# Patient Record
Sex: Female | Born: 1960 | Race: White | Hispanic: No | Marital: Married
Health system: Southern US, Community
[De-identification: ages and names within clinical notes are randomized; demographics above are authoritative.]

## PROBLEM LIST (undated history)

## (undated) DIAGNOSIS — C801 Malignant (primary) neoplasm, unspecified: Secondary | ICD-10-CM

## (undated) DIAGNOSIS — Z923 Personal history of irradiation: Secondary | ICD-10-CM

## (undated) HISTORY — PX: AUGMENTATION MAMMAPLASTY: SUR837

---

## 1998-09-02 ENCOUNTER — Encounter: Admission: RE | Admit: 1998-09-02 | Discharge: 1998-09-02 | Payer: Self-pay | Admitting: *Deleted

## 2008-05-30 ENCOUNTER — Encounter: Admission: RE | Admit: 2008-05-30 | Discharge: 2008-05-30 | Payer: Self-pay | Admitting: Obstetrics and Gynecology

## 2009-01-20 ENCOUNTER — Encounter: Admission: RE | Admit: 2009-01-20 | Discharge: 2009-01-20 | Payer: Self-pay | Admitting: Obstetrics and Gynecology

## 2009-06-30 ENCOUNTER — Encounter: Admission: RE | Admit: 2009-06-30 | Discharge: 2009-06-30 | Payer: Self-pay | Admitting: Obstetrics and Gynecology

## 2009-06-30 ENCOUNTER — Encounter (INDEPENDENT_AMBULATORY_CARE_PROVIDER_SITE_OTHER): Payer: Self-pay | Admitting: Diagnostic Radiology

## 2009-07-06 ENCOUNTER — Encounter: Admission: RE | Admit: 2009-07-06 | Discharge: 2009-07-06 | Payer: Self-pay | Admitting: Obstetrics and Gynecology

## 2009-08-25 ENCOUNTER — Encounter (INDEPENDENT_AMBULATORY_CARE_PROVIDER_SITE_OTHER): Payer: Self-pay | Admitting: Surgery

## 2009-08-25 ENCOUNTER — Inpatient Hospital Stay (HOSPITAL_COMMUNITY): Admission: RE | Admit: 2009-08-25 | Discharge: 2009-08-28 | Payer: Self-pay | Admitting: Surgery

## 2009-09-10 ENCOUNTER — Ambulatory Visit: Payer: Self-pay | Admitting: Oncology

## 2009-09-29 LAB — COMPREHENSIVE METABOLIC PANEL
ALT: 15 U/L (ref 0–35)
AST: 16 U/L (ref 0–37)
Calcium: 8.9 mg/dL (ref 8.4–10.5)
Chloride: 108 mEq/L (ref 96–112)
Creatinine, Ser: 0.8 mg/dL (ref 0.40–1.20)
Potassium: 3.6 mEq/L (ref 3.5–5.3)
Sodium: 142 mEq/L (ref 135–145)
Total Protein: 6.5 g/dL (ref 6.0–8.3)

## 2009-09-29 LAB — CBC WITH DIFFERENTIAL/PLATELET
BASO%: 0.4 % (ref 0.0–2.0)
Basophils Absolute: 0 10*3/uL (ref 0.0–0.1)
EOS%: 2.2 % (ref 0.0–7.0)
HCT: 34.7 % — ABNORMAL LOW (ref 34.8–46.6)
HGB: 12.2 g/dL (ref 11.6–15.9)
MCV: 87.4 fL (ref 79.5–101.0)
MONO#: 0.4 10*3/uL (ref 0.1–0.9)
MONO%: 8.3 % (ref 0.0–14.0)
NEUT%: 52.8 % (ref 38.4–76.8)
Platelets: 161 10*3/uL (ref 145–400)
WBC: 4.4 10*3/uL (ref 3.9–10.3)
lymph#: 1.6 10*3/uL (ref 0.9–3.3)

## 2009-10-06 ENCOUNTER — Encounter: Admission: RE | Admit: 2009-10-06 | Discharge: 2009-10-06 | Payer: Self-pay | Admitting: Oncology

## 2009-10-13 ENCOUNTER — Ambulatory Visit: Payer: Self-pay | Admitting: Oncology

## 2009-10-15 LAB — CBC WITH DIFFERENTIAL/PLATELET
BASO%: 0.5 % (ref 0.0–2.0)
Eosinophils Absolute: 0.1 10*3/uL (ref 0.0–0.5)
HCT: 36 % (ref 34.8–46.6)
LYMPH%: 36.2 % (ref 14.0–49.7)
MCHC: 34.2 g/dL (ref 31.5–36.0)
MCV: 88.3 fL (ref 79.5–101.0)
MONO%: 7.2 % (ref 0.0–14.0)

## 2009-10-15 LAB — COMPREHENSIVE METABOLIC PANEL
AST: 19 U/L (ref 0–37)
Albumin: 3.8 g/dL (ref 3.5–5.2)
BUN: 12 mg/dL (ref 6–23)
Calcium: 8.9 mg/dL (ref 8.4–10.5)
Total Bilirubin: 0.5 mg/dL (ref 0.3–1.2)

## 2010-02-23 ENCOUNTER — Ambulatory Visit: Payer: Self-pay | Admitting: Oncology

## 2010-02-25 LAB — CBC WITH DIFFERENTIAL/PLATELET
BASO%: 0.6 % (ref 0.0–2.0)
HGB: 13.6 g/dL (ref 11.6–15.9)
MCV: 87.9 fL (ref 79.5–101.0)
MONO%: 6.6 % (ref 0.0–14.0)
NEUT#: 2.6 10*3/uL (ref 1.5–6.5)
Platelets: 153 10*3/uL (ref 145–400)
WBC: 4.6 10*3/uL (ref 3.9–10.3)

## 2010-02-25 LAB — COMPREHENSIVE METABOLIC PANEL
ALT: 12 U/L (ref 0–35)
Albumin: 4.4 g/dL (ref 3.5–5.2)
BUN: 12 mg/dL (ref 6–23)
Calcium: 9.2 mg/dL (ref 8.4–10.5)
Creatinine, Ser: 0.81 mg/dL (ref 0.40–1.20)

## 2010-07-01 ENCOUNTER — Encounter: Admission: RE | Admit: 2010-07-01 | Discharge: 2010-07-01 | Payer: Self-pay | Admitting: Oncology

## 2010-08-27 ENCOUNTER — Ambulatory Visit: Payer: Self-pay | Admitting: Oncology

## 2010-08-31 LAB — COMPREHENSIVE METABOLIC PANEL
ALT: 19 U/L (ref 0–35)
AST: 20 U/L (ref 0–37)
Alkaline Phosphatase: 91 U/L (ref 39–117)
BUN: 12 mg/dL (ref 6–23)
CO2: 31 mEq/L (ref 19–32)
Chloride: 106 mEq/L (ref 96–112)
Potassium: 3.6 mEq/L (ref 3.5–5.3)
Sodium: 142 mEq/L (ref 135–145)
Total Bilirubin: 0.8 mg/dL (ref 0.3–1.2)
Total Protein: 7.6 g/dL (ref 6.0–8.3)

## 2010-08-31 LAB — VITAMIN D 25 HYDROXY (VIT D DEFICIENCY, FRACTURES): Vit D, 25-Hydroxy: 53 ng/mL (ref 30–89)

## 2010-08-31 LAB — CBC WITH DIFFERENTIAL/PLATELET
Basophils Absolute: 0 10*3/uL (ref 0.0–0.1)
EOS%: 1.8 % (ref 0.0–7.0)
LYMPH%: 28.4 % (ref 14.0–49.7)
MCH: 30.4 pg (ref 25.1–34.0)
MCV: 89.5 fL (ref 79.5–101.0)
MONO#: 0.5 10*3/uL (ref 0.1–0.9)
MONO%: 7.9 % (ref 0.0–14.0)
RDW: 13.3 % (ref 11.2–14.5)
WBC: 5.8 10*3/uL (ref 3.9–10.3)

## 2011-03-18 LAB — DIFFERENTIAL
Basophils Absolute: 0 10*3/uL (ref 0.0–0.1)
Eosinophils Absolute: 0.1 10*3/uL (ref 0.0–0.7)

## 2011-03-18 LAB — COMPREHENSIVE METABOLIC PANEL
ALT: 16 U/L (ref 0–35)
Albumin: 3.9 g/dL (ref 3.5–5.2)
Alkaline Phosphatase: 73 U/L (ref 39–117)
BUN: 13 mg/dL (ref 6–23)
CO2: 28 mEq/L (ref 19–32)
Calcium: 8.9 mg/dL (ref 8.4–10.5)
Chloride: 107 mEq/L (ref 96–112)
Creatinine, Ser: 0.76 mg/dL (ref 0.4–1.2)
GFR calc Af Amer: >60 mL/min (ref >60)
Glucose, Bld: 92 mg/dL (ref 70–99)
Potassium: 4 mEq/L (ref 3.5–5.1)
Sodium: 142 mEq/L (ref 135–145)
Total Protein: 6.3 g/dL (ref 6.0–8.3)

## 2011-03-18 LAB — CBC
Hemoglobin: 13.1 g/dL (ref 12.0–15.0)
MCHC: 34.8 g/dL (ref 30.0–36.0)
MCV: 90.2 fL (ref 78.0–100.0)
Platelets: 129 10*3/uL — ABNORMAL LOW (ref 150–400)
RBC: 4.16 MIL/uL (ref 3.87–5.11)
WBC: 4.6 10*3/uL (ref 4.0–10.5)

## 2011-06-09 ENCOUNTER — Other Ambulatory Visit: Payer: Self-pay | Admitting: Oncology

## 2011-06-09 ENCOUNTER — Other Ambulatory Visit: Payer: Self-pay | Admitting: Obstetrics and Gynecology

## 2011-06-09 DIAGNOSIS — Z9882 Breast implant status: Secondary | ICD-10-CM

## 2011-06-09 DIAGNOSIS — Z9012 Acquired absence of left breast and nipple: Secondary | ICD-10-CM

## 2011-07-04 ENCOUNTER — Ambulatory Visit
Admission: RE | Admit: 2011-07-04 | Discharge: 2011-07-04 | Disposition: A | Discharge status: Home / Self Care | Payer: BC Managed Care – PPO | Source: Ambulatory Visit | Attending: Obstetrics and Gynecology | Admitting: Obstetrics and Gynecology

## 2011-07-04 DIAGNOSIS — Z9012 Acquired absence of left breast and nipple: Secondary | ICD-10-CM

## 2011-07-04 DIAGNOSIS — Z9882 Breast implant status: Secondary | ICD-10-CM

## 2011-09-01 ENCOUNTER — Other Ambulatory Visit: Payer: Self-pay | Admitting: Oncology

## 2011-09-01 ENCOUNTER — Encounter (HOSPITAL_BASED_OUTPATIENT_CLINIC_OR_DEPARTMENT_OTHER): Payer: BC Managed Care – PPO | Admitting: Oncology

## 2011-09-01 DIAGNOSIS — C50919 Malignant neoplasm of unspecified site of unspecified female breast: Secondary | ICD-10-CM

## 2011-09-01 DIAGNOSIS — D0590 Unspecified type of carcinoma in situ of unspecified breast: Secondary | ICD-10-CM

## 2011-09-01 LAB — COMPREHENSIVE METABOLIC PANEL
ALT: 12 U/L (ref 0–35)
AST: 15 U/L (ref 0–37)
Chloride: 104 mEq/L (ref 96–112)
Creatinine, Ser: 0.64 mg/dL (ref 0.50–1.10)
Total Bilirubin: 0.2 mg/dL — ABNORMAL LOW (ref 0.3–1.2)

## 2011-09-01 LAB — CBC WITH DIFFERENTIAL/PLATELET
BASO%: 0.6 % (ref 0.0–2.0)
EOS%: 2.6 % (ref 0.0–7.0)
HCT: 38.9 % (ref 34.8–46.6)
LYMPH%: 34.5 % (ref 14.0–49.7)
MCH: 30.8 pg (ref 25.1–34.0)
MCHC: 34.6 g/dL (ref 31.5–36.0)
NEUT%: 56.6 % (ref 38.4–76.8)
lymph#: 1.8 10*3/uL (ref 0.9–3.3)

## 2011-09-08 ENCOUNTER — Encounter (HOSPITAL_BASED_OUTPATIENT_CLINIC_OR_DEPARTMENT_OTHER): Payer: BC Managed Care – PPO | Admitting: Oncology

## 2011-09-08 DIAGNOSIS — C50919 Malignant neoplasm of unspecified site of unspecified female breast: Secondary | ICD-10-CM

## 2011-09-08 DIAGNOSIS — Z170 Estrogen receptor positive status [ER+]: Secondary | ICD-10-CM

## 2011-09-08 DIAGNOSIS — D0590 Unspecified type of carcinoma in situ of unspecified breast: Secondary | ICD-10-CM

## 2011-12-13 HISTORY — PX: MASTECTOMY: SHX3

## 2012-05-29 ENCOUNTER — Other Ambulatory Visit: Payer: Self-pay | Admitting: Obstetrics and Gynecology

## 2012-05-29 DIAGNOSIS — Z1231 Encounter for screening mammogram for malignant neoplasm of breast: Secondary | ICD-10-CM

## 2012-07-04 ENCOUNTER — Ambulatory Visit
Admission: RE | Admit: 2012-07-04 | Discharge: 2012-07-04 | Disposition: A | Discharge status: Home / Self Care | Payer: BC Managed Care – PPO | Source: Ambulatory Visit | Attending: Obstetrics and Gynecology | Admitting: Obstetrics and Gynecology

## 2012-07-04 DIAGNOSIS — Z1231 Encounter for screening mammogram for malignant neoplasm of breast: Secondary | ICD-10-CM

## 2012-08-08 ENCOUNTER — Other Ambulatory Visit: Payer: Self-pay | Admitting: Obstetrics and Gynecology

## 2012-08-11 ENCOUNTER — Telehealth: Payer: Self-pay | Admitting: Oncology

## 2012-08-11 NOTE — Telephone Encounter (Signed)
S/w pt re appts for 10/3 and 10/10. Per pt she did not have her calendar w/her and would call back Tuesday re appts.

## 2012-08-14 ENCOUNTER — Telehealth: Payer: Self-pay | Admitting: *Deleted

## 2012-09-13 ENCOUNTER — Other Ambulatory Visit (HOSPITAL_BASED_OUTPATIENT_CLINIC_OR_DEPARTMENT_OTHER): Payer: BC Managed Care – PPO | Admitting: Lab

## 2012-09-13 DIAGNOSIS — C50919 Malignant neoplasm of unspecified site of unspecified female breast: Secondary | ICD-10-CM

## 2012-09-13 LAB — CBC WITH DIFFERENTIAL/PLATELET
BASO%: 0.8 % (ref 0.0–2.0)
Eosinophils Absolute: 0.1 10*3/uL (ref 0.0–0.5)
HCT: 39 % (ref 34.8–46.6)
HGB: 13.6 g/dL (ref 11.6–15.9)
LYMPH%: 41 % (ref 14.0–49.7)
MCHC: 34.9 g/dL (ref 31.5–36.0)
MONO#: 0.3 10*3/uL (ref 0.1–0.9)
NEUT#: 2.7 10*3/uL (ref 1.5–6.5)
NEUT%: 50.1 % (ref 38.4–76.8)
Platelets: 155 10*3/uL (ref 145–400)
WBC: 5.3 10*3/uL (ref 3.9–10.3)
lymph#: 2.2 10*3/uL (ref 0.9–3.3)

## 2012-09-13 LAB — COMPREHENSIVE METABOLIC PANEL (CC13)
ALT: 13 U/L (ref 0–55)
CO2: 24 mEq/L (ref 22–29)
Calcium: 9.1 mg/dL (ref 8.4–10.4)
Chloride: 107 mEq/L (ref 98–107)
Creatinine: 0.8 mg/dL (ref 0.6–1.1)
Glucose: 88 mg/dl (ref 70–99)
Sodium: 142 mEq/L (ref 136–145)
Total Bilirubin: 0.5 mg/dL (ref 0.20–1.20)
Total Protein: 6.8 g/dL (ref 6.4–8.3)

## 2012-09-14 LAB — VITAMIN D 25 HYDROXY (VIT D DEFICIENCY, FRACTURES): Vit D, 25-Hydroxy: 37 ng/mL (ref 30–89)

## 2012-09-20 ENCOUNTER — Ambulatory Visit (HOSPITAL_BASED_OUTPATIENT_CLINIC_OR_DEPARTMENT_OTHER): Payer: BC Managed Care – PPO | Admitting: Oncology

## 2012-09-20 VITALS — BP 132/74 | HR 72 | Temp 98.8°F | Resp 20 | Ht 67.0 in | Wt 137.8 lb

## 2012-09-20 DIAGNOSIS — Z853 Personal history of malignant neoplasm of breast: Secondary | ICD-10-CM

## 2012-09-20 DIAGNOSIS — C50919 Malignant neoplasm of unspecified site of unspecified female breast: Secondary | ICD-10-CM

## 2012-09-20 NOTE — Progress Notes (Signed)
Hematology and Oncology Follow Up Visit  Stefanie Miller [id number] Aug 28, 1961 51 y.o. 09/20/2012 6:03 PM   DIAGNOSIS:   :  History of recurrent DCIS with ER/PR positive, previously on hormonal therapy, status post TRAM flap on left side.  Date of surgery is September 2008.  Now on no medication.  PAST THERAPY:    Interim History:  Patient returns for followup. 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 She is doing well. She has up-to-date on imaging studies. She has no complaints. Most recent DEXA scan was in October 2010.  Medications: I have reviewed the patient's current medications.  Allergies:  Allergies  Allergen Reactions  . Sulfa Antibiotics     Past Medical History, Surgical history, Social history, and Family History were reviewed and updated.  Review of Systems: Constitutional:  Negative for fever, chills, night sweats, anorexia, weight loss, pain. Cardiovascular: no chest pain or dyspnea on exertion Respiratory: negative Neurological: negative Dermatological: negative ENT: negative Skin Gastrointestinal: negative Genito-Urinary: negative Hematological and Lymphatic: negative Breast: negative Musculoskeletal: negative Remaining ROS negative.  Physical Exam:  Blood pressure 132/74, pulse 72, temperature 98.8 F (37.1 C), temperature source Oral, resp. rate 20, height 5\' 7"  (1.702 m), weight 137 lb 12.8 oz (62.506 kg).  ECOG: 0  HEENT:  Sclerae anicteric, conjunctivae pink.  Oropharynx clear.  No mucositis or candidiasis.  Nodes:  No cervical, supraclavicular, or axillary lymphadenopathy palpated.  Breast Exam:  Right breast is benign.  No masses, discharge, skin change, or nipple inversion.  Left breast is status post TRAM flap No masses, discharge, skin change, or nipple inversion..  Lungs:  Clear to auscultation bilaterally.  No crackles, rhonchi, or wheezes.  Heart:  Regular rate and rhythm.  Abdomen:  Soft, nontender.  Positive bowel  sounds.  No organomegaly or masses palpated.  Musculoskeletal:  No focal spinal tenderness to palpation.  Extremities:  Benign.  No peripheral edema or cyanosis.  Skin:  Benign.  Neuro:  Nonfocal.   Lab Results: Lab Results  Component Value Date   WBC 5.3 09/13/2012   HGB 13.6 09/13/2012   HCT 39.0 09/13/2012   MCV 89.8 09/13/2012   PLT 155 09/13/2012     Chemistry      Component Value Date/Time   NA 142 09/13/2012 1416   NA 141 09/01/2011 1606   K 3.9 09/13/2012 1416   K 3.6 09/01/2011 1606   CL 107 09/13/2012 1416   CL 104 09/01/2011 1606   CO2 24 09/13/2012 1416   CO2 29 09/01/2011 1606   BUN 15.0 09/13/2012 1416   BUN 20 09/01/2011 1606   CREATININE 0.8 09/13/2012 1416   CREATININE 0.64 09/01/2011 1606      Component Value Date/Time   CALCIUM 9.1 09/13/2012 1416   CALCIUM 9.3 09/01/2011 1606   ALKPHOS 103 09/13/2012 1416   ALKPHOS 114 09/01/2011 1606   AST 16 09/13/2012 1416   AST 15 09/01/2011 1606   ALT 13 09/13/2012 1416   ALT 12 09/01/2011 1606   BILITOT 0.50 09/13/2012 1416   BILITOT 0.2* 09/01/2011 1606       Radiological Studies:  No results found.   IMPRESSIONS AND PLAN: A 51 y.o. female with   History of DCIS status post mastectomy and TRAM flap 5 years of hormonal therapy. She is doing well. She has a lot of good followup with her primary care Dr. and OB/GYN. She is interested in having called with those physicians. I have therefore not scheduled her  for regular followup visit. I have the left the situation open so that she can come back and see [place] at anytime in the future, should the need arise.  Spent more than half the time coordinating care, as well as discussion of BMI and its implications.      Stefanie Miller 10/10/20136:03 PM Cell [phone]

## 2013-05-27 ENCOUNTER — Other Ambulatory Visit: Payer: Self-pay

## 2013-05-27 DIAGNOSIS — Z1231 Encounter for screening mammogram for malignant neoplasm of breast: Secondary | ICD-10-CM

## 2013-07-05 ENCOUNTER — Ambulatory Visit
Admission: RE | Admit: 2013-07-05 | Discharge: 2013-07-05 | Disposition: A | Discharge status: Home / Self Care | Payer: No Typology Code available for payment source | Source: Ambulatory Visit

## 2013-07-05 ENCOUNTER — Other Ambulatory Visit: Payer: Self-pay

## 2013-07-05 DIAGNOSIS — Z1231 Encounter for screening mammogram for malignant neoplasm of breast: Secondary | ICD-10-CM

## 2014-02-24 ENCOUNTER — Ambulatory Visit
Admission: RE | Admit: 2014-02-24 | Discharge: 2014-02-24 | Disposition: A | Discharge status: Home / Self Care | Payer: PRIVATE HEALTH INSURANCE | Source: Ambulatory Visit | Attending: Family Medicine | Admitting: Family Medicine

## 2014-02-24 ENCOUNTER — Other Ambulatory Visit: Payer: Self-pay | Admitting: Family Medicine

## 2014-02-24 DIAGNOSIS — R5383 Other fatigue: Secondary | ICD-10-CM

## 2014-02-24 HISTORY — DX: Malignant (primary) neoplasm, unspecified: C80.1

## 2014-06-02 ENCOUNTER — Other Ambulatory Visit: Payer: Self-pay

## 2014-06-02 DIAGNOSIS — Z1231 Encounter for screening mammogram for malignant neoplasm of breast: Secondary | ICD-10-CM

## 2014-07-09 ENCOUNTER — Encounter (INDEPENDENT_AMBULATORY_CARE_PROVIDER_SITE_OTHER): Payer: Self-pay

## 2014-07-09 ENCOUNTER — Ambulatory Visit
Admission: RE | Admit: 2014-07-09 | Discharge: 2014-07-09 | Disposition: A | Discharge status: Home / Self Care | Payer: No Typology Code available for payment source | Source: Ambulatory Visit

## 2014-07-09 ENCOUNTER — Other Ambulatory Visit: Payer: Self-pay

## 2014-07-09 DIAGNOSIS — Z1231 Encounter for screening mammogram for malignant neoplasm of breast: Secondary | ICD-10-CM

## 2014-08-26 ENCOUNTER — Other Ambulatory Visit: Payer: Self-pay | Admitting: Obstetrics and Gynecology

## 2014-08-27 LAB — CYTOLOGY - PAP

## 2015-06-08 ENCOUNTER — Other Ambulatory Visit: Payer: Self-pay

## 2015-06-08 DIAGNOSIS — Z1231 Encounter for screening mammogram for malignant neoplasm of breast: Secondary | ICD-10-CM

## 2015-07-10 ENCOUNTER — Other Ambulatory Visit: Payer: Self-pay | Admitting: Obstetrics and Gynecology

## 2015-07-14 ENCOUNTER — Ambulatory Visit
Admission: RE | Admit: 2015-07-14 | Discharge: 2015-07-14 | Disposition: A | Discharge status: Home / Self Care | Payer: No Typology Code available for payment source | Source: Ambulatory Visit

## 2015-07-14 DIAGNOSIS — Z1231 Encounter for screening mammogram for malignant neoplasm of breast: Secondary | ICD-10-CM

## 2015-10-20 ENCOUNTER — Telehealth: Payer: Self-pay | Admitting: Oncology

## 2015-10-20 NOTE — Telephone Encounter (Signed)
Request records from 2010-2013

## 2016-01-19 ENCOUNTER — Other Ambulatory Visit: Payer: Self-pay | Admitting: Family Medicine

## 2016-01-19 DIAGNOSIS — R591 Generalized enlarged lymph nodes: Secondary | ICD-10-CM

## 2016-01-25 ENCOUNTER — Ambulatory Visit
Admission: RE | Admit: 2016-01-25 | Discharge: 2016-01-25 | Disposition: A | Discharge status: Home / Self Care | Payer: 59 | Source: Ambulatory Visit | Attending: Family Medicine | Admitting: Family Medicine

## 2016-01-25 DIAGNOSIS — R591 Generalized enlarged lymph nodes: Secondary | ICD-10-CM

## 2016-01-25 MED ORDER — IOPAMIDOL (ISOVUE-300) INJECTION 61%
75.0000 mL | Freq: Once | INTRAVENOUS | Status: AC | PRN
  Administered 2016-01-25: 75 mL via INTRAVENOUS

## 2016-02-23 ENCOUNTER — Other Ambulatory Visit: Payer: Self-pay | Admitting: Cardiology

## 2016-02-23 ENCOUNTER — Ambulatory Visit
Admission: RE | Admit: 2016-02-23 | Discharge: 2016-02-23 | Disposition: A | Discharge status: Home / Self Care | Payer: 59 | Source: Ambulatory Visit | Attending: Cardiology | Admitting: Cardiology

## 2016-02-23 DIAGNOSIS — R0789 Other chest pain: Secondary | ICD-10-CM

## 2016-07-19 ENCOUNTER — Other Ambulatory Visit: Payer: Self-pay | Admitting: Obstetrics and Gynecology

## 2016-07-19 DIAGNOSIS — Z1231 Encounter for screening mammogram for malignant neoplasm of breast: Secondary | ICD-10-CM

## 2016-07-22 ENCOUNTER — Ambulatory Visit: Payer: 59

## 2016-07-26 ENCOUNTER — Other Ambulatory Visit: Payer: Self-pay | Admitting: Obstetrics and Gynecology

## 2016-07-26 ENCOUNTER — Ambulatory Visit
Admission: RE | Admit: 2016-07-26 | Discharge: 2016-07-26 | Disposition: A | Discharge status: Home / Self Care | Payer: 59 | Source: Ambulatory Visit | Attending: Obstetrics and Gynecology | Admitting: Obstetrics and Gynecology

## 2016-07-26 DIAGNOSIS — Z1231 Encounter for screening mammogram for malignant neoplasm of breast: Secondary | ICD-10-CM

## 2017-07-07 ENCOUNTER — Other Ambulatory Visit: Payer: Self-pay | Admitting: Obstetrics and Gynecology

## 2017-07-07 DIAGNOSIS — Z1231 Encounter for screening mammogram for malignant neoplasm of breast: Secondary | ICD-10-CM

## 2017-07-28 ENCOUNTER — Ambulatory Visit
Admission: RE | Admit: 2017-07-28 | Discharge: 2017-07-28 | Disposition: A | Discharge status: Home / Self Care | Payer: BLUE CROSS/BLUE SHIELD | Source: Ambulatory Visit | Attending: Obstetrics and Gynecology | Admitting: Obstetrics and Gynecology

## 2017-07-28 DIAGNOSIS — Z1231 Encounter for screening mammogram for malignant neoplasm of breast: Secondary | ICD-10-CM

## 2017-10-10 DIAGNOSIS — R300 Dysuria: Secondary | ICD-10-CM | POA: Diagnosis not present

## 2017-10-10 DIAGNOSIS — N3090 Cystitis, unspecified without hematuria: Secondary | ICD-10-CM | POA: Diagnosis not present

## 2017-10-27 DIAGNOSIS — Z124 Encounter for screening for malignant neoplasm of cervix: Secondary | ICD-10-CM | POA: Diagnosis not present

## 2017-10-27 DIAGNOSIS — Z01419 Encounter for gynecological examination (general) (routine) without abnormal findings: Secondary | ICD-10-CM | POA: Diagnosis not present

## 2018-02-06 DIAGNOSIS — H6121 Impacted cerumen, right ear: Secondary | ICD-10-CM | POA: Diagnosis not present

## 2018-02-06 DIAGNOSIS — R454 Irritability and anger: Secondary | ICD-10-CM | POA: Diagnosis not present

## 2018-04-20 DIAGNOSIS — R197 Diarrhea, unspecified: Secondary | ICD-10-CM | POA: Diagnosis not present

## 2018-04-20 DIAGNOSIS — M792 Neuralgia and neuritis, unspecified: Secondary | ICD-10-CM | POA: Diagnosis not present

## 2018-04-20 DIAGNOSIS — M545 Low back pain: Secondary | ICD-10-CM | POA: Diagnosis not present

## 2018-05-02 DIAGNOSIS — L821 Other seborrheic keratosis: Secondary | ICD-10-CM | POA: Diagnosis not present

## 2018-05-02 DIAGNOSIS — Z1283 Encounter for screening for malignant neoplasm of skin: Secondary | ICD-10-CM | POA: Diagnosis not present

## 2018-05-02 DIAGNOSIS — X32XXXD Exposure to sunlight, subsequent encounter: Secondary | ICD-10-CM | POA: Diagnosis not present

## 2018-05-02 DIAGNOSIS — L570 Actinic keratosis: Secondary | ICD-10-CM | POA: Diagnosis not present

## 2018-05-02 DIAGNOSIS — L820 Inflamed seborrheic keratosis: Secondary | ICD-10-CM | POA: Diagnosis not present

## 2018-05-02 DIAGNOSIS — C4441 Basal cell carcinoma of skin of scalp and neck: Secondary | ICD-10-CM | POA: Diagnosis not present

## 2018-07-24 ENCOUNTER — Other Ambulatory Visit: Payer: Self-pay | Admitting: Family Medicine

## 2018-07-24 DIAGNOSIS — Z1231 Encounter for screening mammogram for malignant neoplasm of breast: Secondary | ICD-10-CM

## 2018-08-20 DIAGNOSIS — R197 Diarrhea, unspecified: Secondary | ICD-10-CM | POA: Diagnosis not present

## 2018-08-20 DIAGNOSIS — Z23 Encounter for immunization: Secondary | ICD-10-CM | POA: Diagnosis not present

## 2018-08-22 ENCOUNTER — Ambulatory Visit
Admission: RE | Admit: 2018-08-22 | Discharge: 2018-08-22 | Disposition: A | Discharge status: Home / Self Care | Payer: 59 | Source: Ambulatory Visit | Attending: Family Medicine | Admitting: Family Medicine

## 2018-08-22 DIAGNOSIS — R197 Diarrhea, unspecified: Secondary | ICD-10-CM | POA: Diagnosis not present

## 2018-08-22 DIAGNOSIS — Z1231 Encounter for screening mammogram for malignant neoplasm of breast: Secondary | ICD-10-CM

## 2018-08-24 DIAGNOSIS — R197 Diarrhea, unspecified: Secondary | ICD-10-CM | POA: Diagnosis not present

## 2018-11-06 DIAGNOSIS — Z13220 Encounter for screening for lipoid disorders: Secondary | ICD-10-CM | POA: Diagnosis not present

## 2018-11-06 DIAGNOSIS — Z0000 Encounter for general adult medical examination without abnormal findings: Secondary | ICD-10-CM | POA: Diagnosis not present

## 2018-11-14 DIAGNOSIS — L570 Actinic keratosis: Secondary | ICD-10-CM | POA: Diagnosis not present

## 2018-11-14 DIAGNOSIS — X32XXXD Exposure to sunlight, subsequent encounter: Secondary | ICD-10-CM | POA: Diagnosis not present

## 2018-11-14 DIAGNOSIS — L308 Other specified dermatitis: Secondary | ICD-10-CM | POA: Diagnosis not present

## 2018-11-14 DIAGNOSIS — L820 Inflamed seborrheic keratosis: Secondary | ICD-10-CM | POA: Diagnosis not present

## 2018-11-27 DIAGNOSIS — Z01419 Encounter for gynecological examination (general) (routine) without abnormal findings: Secondary | ICD-10-CM | POA: Diagnosis not present

## 2019-07-31 ENCOUNTER — Other Ambulatory Visit: Payer: Self-pay | Admitting: Family Medicine

## 2019-07-31 DIAGNOSIS — Z1231 Encounter for screening mammogram for malignant neoplasm of breast: Secondary | ICD-10-CM

## 2019-09-17 ENCOUNTER — Other Ambulatory Visit: Payer: Self-pay

## 2019-09-17 ENCOUNTER — Ambulatory Visit
Admission: RE | Admit: 2019-09-17 | Discharge: 2019-09-17 | Disposition: A | Discharge status: Home / Self Care | Payer: 59 | Source: Ambulatory Visit | Attending: Family Medicine | Admitting: Family Medicine

## 2019-09-17 DIAGNOSIS — Z1231 Encounter for screening mammogram for malignant neoplasm of breast: Secondary | ICD-10-CM

## 2020-02-03 ENCOUNTER — Other Ambulatory Visit: Payer: Self-pay | Admitting: Obstetrics and Gynecology

## 2020-02-03 DIAGNOSIS — E2839 Other primary ovarian failure: Secondary | ICD-10-CM

## 2020-04-08 ENCOUNTER — Other Ambulatory Visit: Payer: Self-pay

## 2020-04-08 ENCOUNTER — Ambulatory Visit
Admission: RE | Admit: 2020-04-08 | Discharge: 2020-04-08 | Disposition: A | Discharge status: Home / Self Care | Payer: 59 | Source: Ambulatory Visit | Attending: Obstetrics and Gynecology | Admitting: Obstetrics and Gynecology

## 2020-04-08 DIAGNOSIS — E2839 Other primary ovarian failure: Secondary | ICD-10-CM

## 2020-08-24 ENCOUNTER — Other Ambulatory Visit: Payer: Self-pay | Admitting: Family Medicine

## 2020-08-24 DIAGNOSIS — Z1231 Encounter for screening mammogram for malignant neoplasm of breast: Secondary | ICD-10-CM

## 2020-09-17 ENCOUNTER — Ambulatory Visit
Admission: RE | Admit: 2020-09-17 | Discharge: 2020-09-17 | Disposition: A | Discharge status: Home / Self Care | Payer: Managed Care, Other (non HMO) | Source: Ambulatory Visit | Attending: Family Medicine | Admitting: Family Medicine

## 2020-09-17 ENCOUNTER — Other Ambulatory Visit: Payer: Self-pay

## 2020-09-17 DIAGNOSIS — Z1231 Encounter for screening mammogram for malignant neoplasm of breast: Secondary | ICD-10-CM

## 2021-09-13 ENCOUNTER — Other Ambulatory Visit: Payer: Self-pay | Admitting: Internal Medicine

## 2021-09-13 DIAGNOSIS — Z1231 Encounter for screening mammogram for malignant neoplasm of breast: Secondary | ICD-10-CM

## 2021-10-26 ENCOUNTER — Other Ambulatory Visit: Payer: Self-pay

## 2021-10-26 ENCOUNTER — Ambulatory Visit
Admission: RE | Admit: 2021-10-26 | Discharge: 2021-10-26 | Disposition: A | Discharge status: Home / Self Care | Payer: Managed Care, Other (non HMO) | Source: Ambulatory Visit | Attending: Family Medicine | Admitting: Family Medicine

## 2021-10-26 ENCOUNTER — Other Ambulatory Visit: Payer: Self-pay | Admitting: Family Medicine

## 2021-10-26 DIAGNOSIS — R0609 Other forms of dyspnea: Secondary | ICD-10-CM

## 2021-11-09 ENCOUNTER — Ambulatory Visit
Admission: RE | Admit: 2021-11-09 | Discharge: 2021-11-09 | Disposition: A | Discharge status: Home / Self Care | Payer: Managed Care, Other (non HMO) | Source: Ambulatory Visit | Attending: Internal Medicine | Admitting: Internal Medicine

## 2021-11-09 ENCOUNTER — Other Ambulatory Visit: Payer: Self-pay

## 2021-11-09 DIAGNOSIS — Z1231 Encounter for screening mammogram for malignant neoplasm of breast: Secondary | ICD-10-CM

## 2022-03-24 ENCOUNTER — Ambulatory Visit (INDEPENDENT_AMBULATORY_CARE_PROVIDER_SITE_OTHER): Payer: Managed Care, Other (non HMO) | Admitting: Sports Medicine

## 2022-03-24 VITALS — BP 126/82 | Ht 67.75 in | Wt 135.0 lb

## 2022-03-24 DIAGNOSIS — M545 Low back pain, unspecified: Secondary | ICD-10-CM

## 2022-03-24 DIAGNOSIS — G8929 Other chronic pain: Secondary | ICD-10-CM

## 2022-03-24 NOTE — Progress Notes (Addendum)
PCP: Iona Hansen, PA-C ? ?Subjective:  ? ?HPI: ?[name] is a 61 year old female who presents today for evaluation of low back pain. ? ?[name] states she began to experience lower back pain approximately 4 to 6 months ago. The pain is located on her right lower back, does not radiate elsewhere.  She first noticed it whenever she was running outside but it became significantly worse when she began to run on a treadmill.  Due to this, she stopped running altogether.  Now she notices the pain becomes worse when playing tennis, usually by the second match.  She denies any numbness or tingling down her into her legs.  She denies any urinary, bowel incontinence.  She has tried taking Aleve without any alleviation of pain. She is wondering if it may be related to her high arch.  ? ?Past Medical History:  ?Diagnosis Date  ? Cancer [facility])   ? ?No current outpatient medications on file prior to visit.  ? ?No current facility-administered medications on file prior to visit.  ? ?Past Surgical History:  ?Procedure Laterality Date  ? AUGMENTATION MAMMAPLASTY Left   ? MASTECTOMY Left 2013  ? ?Allergies  ?Allergen Reactions  ? Sulfa Antibiotics   ? ?BP 126/82   Ht 5' 7.75" (1.721 m)   Wt 135 lb (61.2 kg)   BMI 20.68 kg/m?  ? ? ?  03/24/2022  ?  9:27 AM  ?[place] [facility]  ?Frequency of aerobic exercise (# of days/week) 3  ?Average time in minutes 30  ?Frequency of strengthening activities (# of days/week) 0  ? ?Objective:  ? ?Physical Exam: ? ?General: NAD, comfortable in exam room ? ?Right Hip Pain: ?- Inspection: No gross deformity, no swelling ?- Palpation: No TTP, specifically none over greater trochanter ?- ROM: Normal range of motion on flexion, extension, abduction, internal and external rotation ?- Strength: Normal strength in all fields  ?- Neuro/vasc: NV intact distally  ?- Special Tests: Negative FABER and FADIR ? ?Lumbar spine: ?- Inspection: no gross deformity or  asymmetry, swelling or ecchymosis ?- Palpation: No TTP over the spinous processes, paraspinal muscles, or SI joints b/l, however patient pointed to the SI joint as the location of her pain ?- ROM: full active ROM of the lumbar spine in flexion and extension without pain ?- Strength: 5/5 strength of lower extremity ?- Special testing: Negative straight leg raise ? ?Feet: Rigid cavus foot bilaterally. ? ?Assessment & Plan:  ? ?Chronic low back pain:  ?[name] describes several month history of severe low back pain that is right-sided. Overall, examination is re-assuring with no red-flag findings. On examination, she denies any pain on palpation at this time but pointed towards her SI joint as the location the pain occurs. Differential includes degenerative changes, likely involving lumbar spine versus SI joint. Will start work up with x-rays and continue conservative management at this time with OTC NSAIDs. ? ?Patient discussed and seen with Dr. Micheline Chapman. ? ?Dr. Jose Persia ?Internal Medicine PGY-3  ?03/24/2022, 5:20 PM ? ?Patient seen and evaluated with the resident.  I agree with the above plan of care.  Patient's history suggest possible SI joint arthropathy or facet arthropathy as her pain generator.  Physical exam today is fairly benign.  I would like to get x-rays of her lumbar spine as well as of her pelvis and she will follow-up with me in a couple of weeks to discuss those results.  I think she may benefit from  a well cushioned custom orthotic given her rigid cavus foot as well as the fact that her back pain occurs during impact activities such as running and tennis. ?

## 2022-03-29 ENCOUNTER — Ambulatory Visit
Admission: RE | Admit: 2022-03-29 | Discharge: 2022-03-29 | Disposition: A | Discharge status: Home / Self Care | Payer: Managed Care, Other (non HMO) | Source: Ambulatory Visit | Attending: Sports Medicine | Admitting: Sports Medicine

## 2022-03-29 DIAGNOSIS — M545 Low back pain, unspecified: Secondary | ICD-10-CM

## 2022-04-28 ENCOUNTER — Ambulatory Visit (INDEPENDENT_AMBULATORY_CARE_PROVIDER_SITE_OTHER): Payer: Managed Care, Other (non HMO) | Admitting: Sports Medicine

## 2022-04-28 VITALS — BP 126/80 | Ht 67.75 in | Wt 135.0 lb

## 2022-04-28 DIAGNOSIS — M5136 Other intervertebral disc degeneration, lumbar region: Secondary | ICD-10-CM | POA: Diagnosis not present

## 2022-04-28 DIAGNOSIS — M51369 Other intervertebral disc degeneration, lumbar region without mention of lumbar back pain or lower extremity pain: Secondary | ICD-10-CM

## 2022-04-29 NOTE — Progress Notes (Signed)
Patient ID: Stefanie Miller, female   DOB: 01/14/1961, 61 y.o.   MRN: [id number]  Stefanie Miller presents today for follow-up to discuss x-ray findings of her lumbar spine and sacrum.  There is moderate levoscoliosis with severe degenerative changes noted throughout the lumbar spine.  The most severe levels are L1-L2 L4-L5 and L5-S1.  SI joints show some slight sclerosis consistent with mild degenerative changes here but not severe.  Fortunately, Stefanie Miller seems to be doing well.  Physical exam was not repeated today.  We simply talked about her x-rays and treatment going forward.  Her symptoms are currently tolerable.  She does take 800 mg of Advil prior to playing competitive tennis and this does help quite a bit.  At this point in time I recommend that she continue with this.  She is cautioned about GI upset and she also understands the importance of following up with her PCP for blood work.  If her symptoms become severe we could consider a short course of oral prednisone or an MRI of her lumbar spine in anticipation of lumbar facet injections.  Follow-up as needed.  This note was dictated using Dragon naturally speaking software and may contain errors in syntax, spelling, or content which have not been identified prior to signing this note.

## 2022-10-19 ENCOUNTER — Other Ambulatory Visit: Payer: Self-pay | Admitting: Family Medicine

## 2022-10-19 DIAGNOSIS — Z1231 Encounter for screening mammogram for malignant neoplasm of breast: Secondary | ICD-10-CM

## 2023-02-06 ENCOUNTER — Encounter: Payer: Self-pay | Admitting: Sports Medicine

## 2023-02-06 ENCOUNTER — Other Ambulatory Visit: Payer: Self-pay

## 2023-02-06 DIAGNOSIS — M5136 Other intervertebral disc degeneration, lumbar region: Secondary | ICD-10-CM

## 2023-03-15 ENCOUNTER — Ambulatory Visit
Admission: RE | Admit: 2023-03-15 | Discharge: 2023-03-15 | Disposition: A | Discharge status: Home / Self Care | Payer: Managed Care, Other (non HMO) | Source: Ambulatory Visit | Attending: Family Medicine | Admitting: Family Medicine

## 2023-03-15 DIAGNOSIS — Z1231 Encounter for screening mammogram for malignant neoplasm of breast: Secondary | ICD-10-CM

## 2023-03-15 HISTORY — DX: Personal history of irradiation: Z92.3

## 2023-03-31 ENCOUNTER — Other Ambulatory Visit: Payer: Self-pay | Admitting: Family Medicine

## 2023-03-31 DIAGNOSIS — R1010 Upper abdominal pain, unspecified: Secondary | ICD-10-CM

## 2023-04-27 ENCOUNTER — Ambulatory Visit
Admission: RE | Admit: 2023-04-27 | Discharge: 2023-04-27 | Disposition: A | Discharge status: Home / Self Care | Payer: Managed Care, Other (non HMO) | Source: Ambulatory Visit | Attending: Family Medicine | Admitting: Family Medicine

## 2023-04-27 DIAGNOSIS — R1010 Upper abdominal pain, unspecified: Secondary | ICD-10-CM

## 2023-07-30 IMAGING — DX DG LUMBAR SPINE 2-3V
2 series · 2 of 2 positions shown · non-contrast
Comparison: None.

CLINICAL DATA: Chronic right-sided low back and sacral pain.

EXAM:
LUMBAR SPINE - 2-3 VIEW

[view not recorded (1 of 2)]
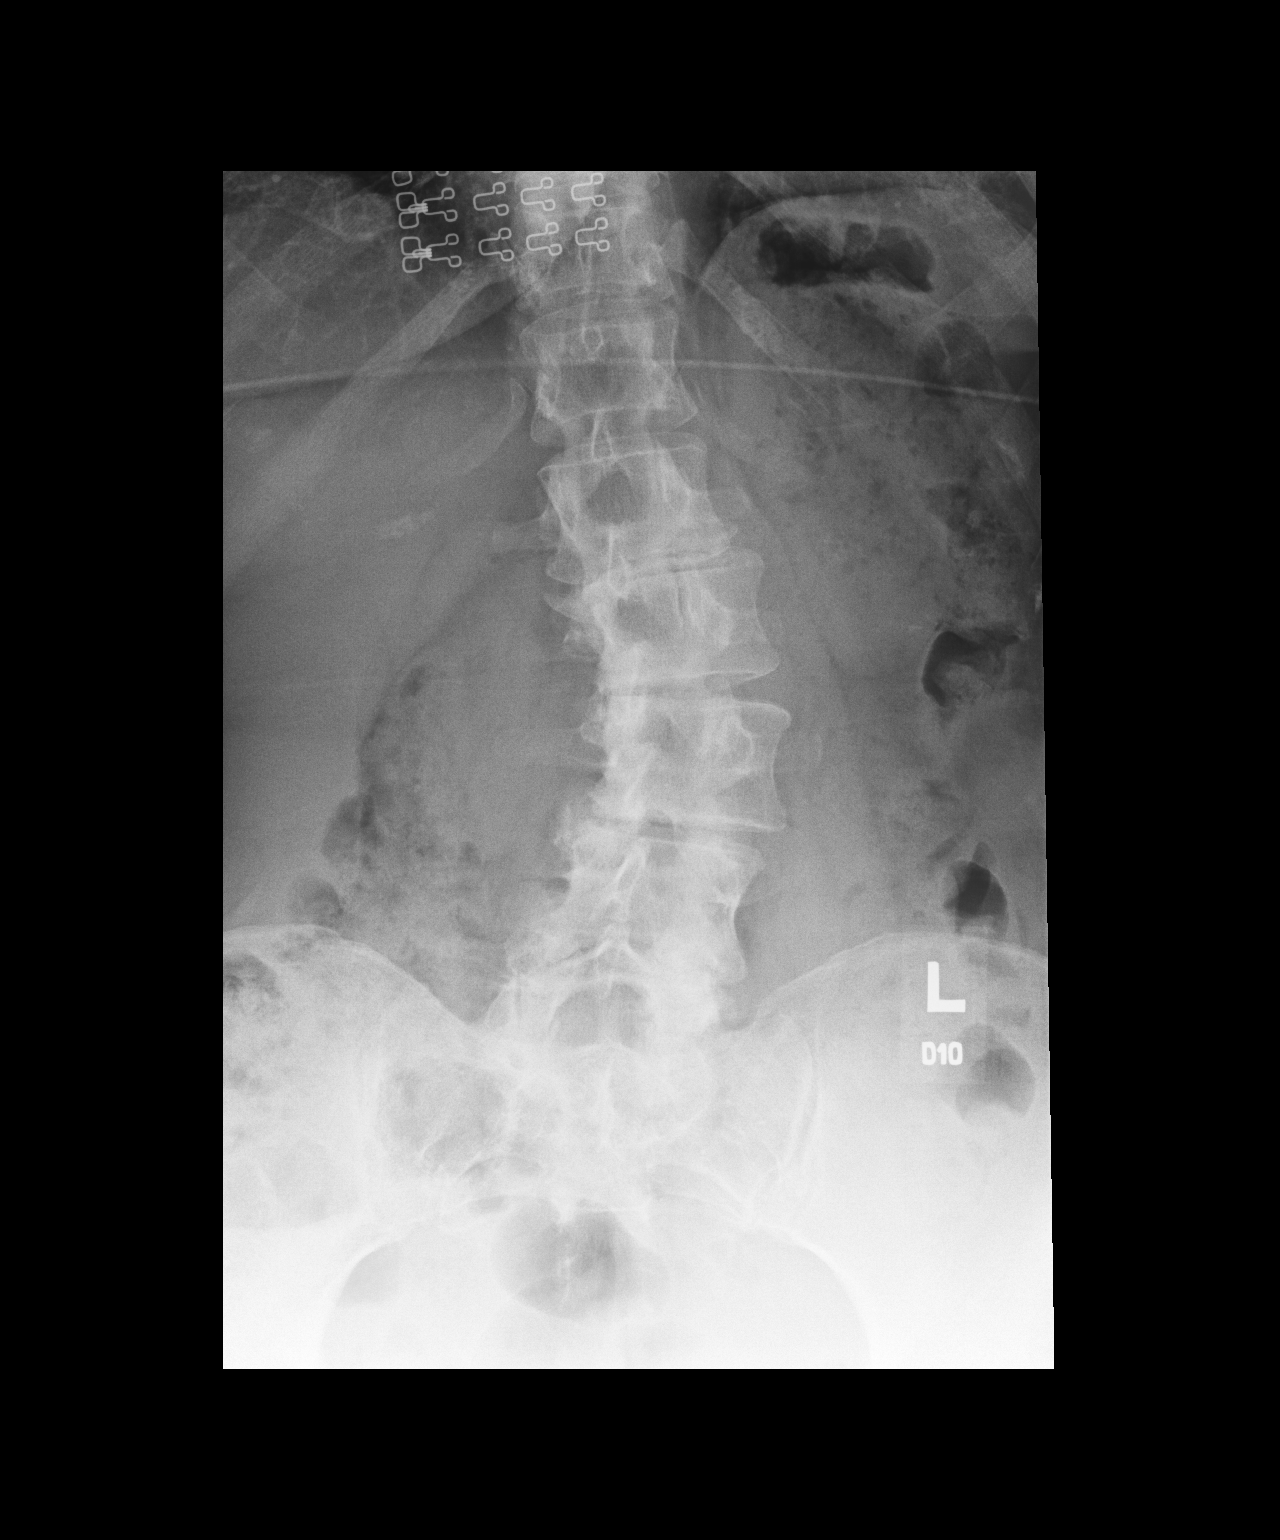

[view not recorded (2 of 2)]
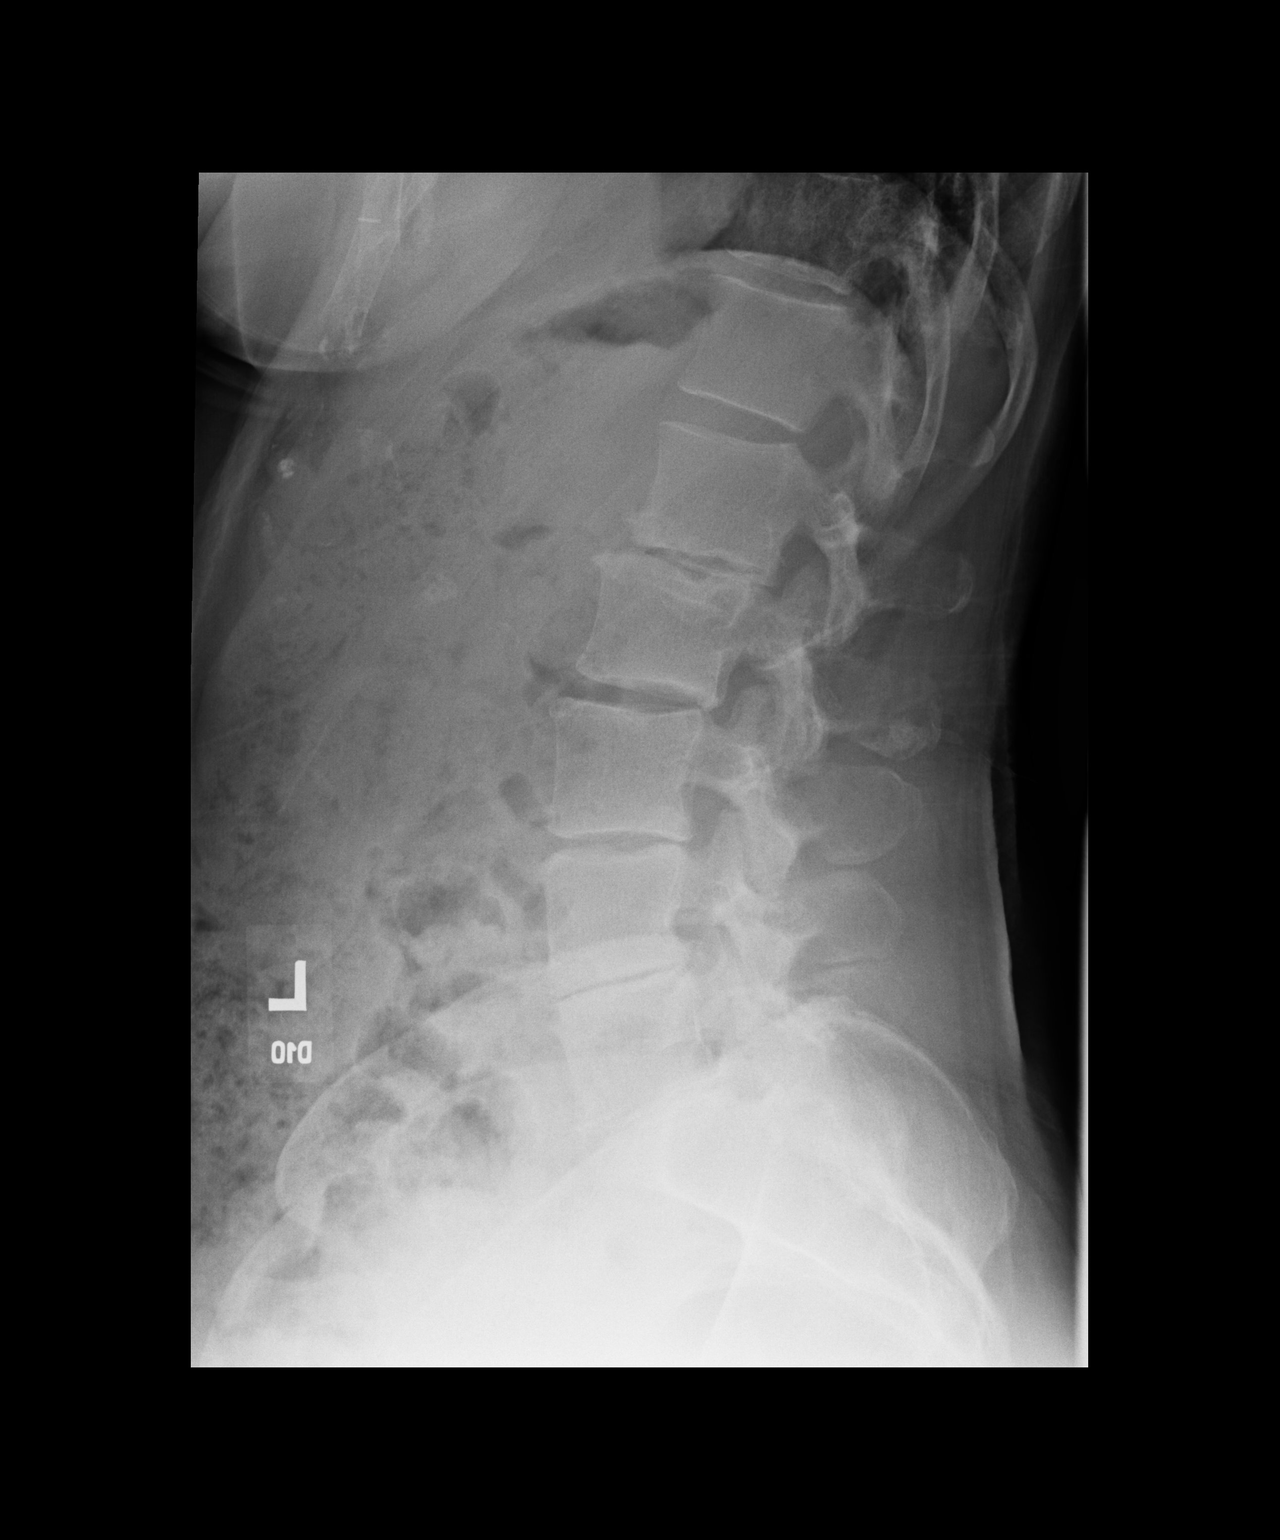

[2 of 2 positions shown; findings below may reference images not displayed]

FINDINGS: No recent fracture is seen. There is moderate levoscoliosis.
Degenerative changes are noted with disc space narrowing, bony spurs
and facet hypertrophy throughout lumbar spine, more severe from
L3-S1 levels. Paraspinal soft tissues are unremarkable.
IMPRESSION: No recent fracture is seen in the lumbar spine. Severe degenerative
changes are noted with disc space narrowing, bony spurs and facet
hypertrophy throughout lumbar spine, more severe at L1-L2, L3-L4,
L4-L5 and L5-S1 levels.

## 2024-02-13 DIAGNOSIS — X32XXXD Exposure to sunlight, subsequent encounter: Secondary | ICD-10-CM | POA: Diagnosis not present

## 2024-02-13 DIAGNOSIS — L821 Other seborrheic keratosis: Secondary | ICD-10-CM | POA: Diagnosis not present

## 2024-02-13 DIAGNOSIS — C44612 Basal cell carcinoma of skin of right upper limb, including shoulder: Secondary | ICD-10-CM | POA: Diagnosis not present

## 2024-02-13 DIAGNOSIS — D225 Melanocytic nevi of trunk: Secondary | ICD-10-CM | POA: Diagnosis not present

## 2024-02-13 DIAGNOSIS — L570 Actinic keratosis: Secondary | ICD-10-CM | POA: Diagnosis not present

## 2024-03-06 ENCOUNTER — Other Ambulatory Visit: Payer: Self-pay | Admitting: Family Medicine

## 2024-03-06 DIAGNOSIS — Z1231 Encounter for screening mammogram for malignant neoplasm of breast: Secondary | ICD-10-CM

## 2024-03-20 ENCOUNTER — Ambulatory Visit
Admission: RE | Admit: 2024-03-20 | Discharge: 2024-03-20 | Disposition: A | Discharge status: Home / Self Care | Source: Ambulatory Visit | Attending: Family Medicine | Admitting: Family Medicine

## 2024-03-20 DIAGNOSIS — Z1231 Encounter for screening mammogram for malignant neoplasm of breast: Secondary | ICD-10-CM

## 2024-04-16 ENCOUNTER — Other Ambulatory Visit: Payer: Self-pay | Admitting: Family Medicine

## 2024-04-16 DIAGNOSIS — E559 Vitamin D deficiency, unspecified: Secondary | ICD-10-CM | POA: Diagnosis not present

## 2024-04-16 DIAGNOSIS — E785 Hyperlipidemia, unspecified: Secondary | ICD-10-CM | POA: Diagnosis not present

## 2024-04-16 DIAGNOSIS — Z0000 Encounter for general adult medical examination without abnormal findings: Secondary | ICD-10-CM | POA: Diagnosis not present

## 2024-04-16 DIAGNOSIS — F5101 Primary insomnia: Secondary | ICD-10-CM | POA: Diagnosis not present

## 2024-04-16 DIAGNOSIS — M8580 Other specified disorders of bone density and structure, unspecified site: Secondary | ICD-10-CM

## 2024-04-16 DIAGNOSIS — F419 Anxiety disorder, unspecified: Secondary | ICD-10-CM | POA: Diagnosis not present

## 2024-04-16 DIAGNOSIS — M8588 Other specified disorders of bone density and structure, other site: Secondary | ICD-10-CM | POA: Diagnosis not present

## 2024-04-29 DIAGNOSIS — L570 Actinic keratosis: Secondary | ICD-10-CM | POA: Diagnosis not present

## 2024-04-29 DIAGNOSIS — L218 Other seborrheic dermatitis: Secondary | ICD-10-CM | POA: Diagnosis not present

## 2024-04-29 DIAGNOSIS — Z85828 Personal history of other malignant neoplasm of skin: Secondary | ICD-10-CM | POA: Diagnosis not present

## 2024-04-29 DIAGNOSIS — Z08 Encounter for follow-up examination after completed treatment for malignant neoplasm: Secondary | ICD-10-CM | POA: Diagnosis not present

## 2024-04-29 DIAGNOSIS — X32XXXD Exposure to sunlight, subsequent encounter: Secondary | ICD-10-CM | POA: Diagnosis not present

## 2024-06-25 DIAGNOSIS — L910 Hypertrophic scar: Secondary | ICD-10-CM | POA: Diagnosis not present

## 2024-06-25 DIAGNOSIS — H02834 Dermatochalasis of left upper eyelid: Secondary | ICD-10-CM | POA: Diagnosis not present

## 2024-06-25 DIAGNOSIS — H57813 Brow ptosis, bilateral: Secondary | ICD-10-CM | POA: Diagnosis not present

## 2024-06-25 DIAGNOSIS — H02831 Dermatochalasis of right upper eyelid: Secondary | ICD-10-CM | POA: Diagnosis not present

## 2024-09-16 DIAGNOSIS — N390 Urinary tract infection, site not specified: Secondary | ICD-10-CM | POA: Diagnosis not present

## 2024-10-10 DIAGNOSIS — R300 Dysuria: Secondary | ICD-10-CM | POA: Diagnosis not present

## 2024-10-10 DIAGNOSIS — Z01411 Encounter for gynecological examination (general) (routine) with abnormal findings: Secondary | ICD-10-CM | POA: Diagnosis not present

## 2024-10-24 ENCOUNTER — Other Ambulatory Visit

## 2024-11-06 DIAGNOSIS — M25531 Pain in right wrist: Secondary | ICD-10-CM | POA: Diagnosis not present

## 2024-11-13 DIAGNOSIS — R8290 Unspecified abnormal findings in urine: Secondary | ICD-10-CM | POA: Diagnosis not present

## 2024-11-13 DIAGNOSIS — R350 Frequency of micturition: Secondary | ICD-10-CM | POA: Diagnosis not present

## 2024-11-13 DIAGNOSIS — N909 Noninflammatory disorder of vulva and perineum, unspecified: Secondary | ICD-10-CM | POA: Diagnosis not present

## 2024-11-20 DIAGNOSIS — L820 Inflamed seborrheic keratosis: Secondary | ICD-10-CM | POA: Diagnosis not present

## 2024-11-20 DIAGNOSIS — X32XXXD Exposure to sunlight, subsequent encounter: Secondary | ICD-10-CM | POA: Diagnosis not present

## 2024-11-20 DIAGNOSIS — L570 Actinic keratosis: Secondary | ICD-10-CM | POA: Diagnosis not present

## 2025-04-10 ENCOUNTER — Other Ambulatory Visit
